# Patient Record
Sex: Male | Born: 1995 | Race: Black or African American | Hispanic: No | Marital: Single | Smoking: Never smoker
Health system: Southern US, Community
[De-identification: ages and names within clinical notes are randomized; demographics above are authoritative.]

---
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
## 2017-06-14 ENCOUNTER — Encounter (HOSPITAL_COMMUNITY): Payer: Self-pay | Admitting: Emergency Medicine

## 2017-06-14 ENCOUNTER — Ambulatory Visit (HOSPITAL_COMMUNITY)
Admission: EM | Admit: 2017-06-14 | Discharge: 2017-06-14 | Disposition: A | Discharge status: Home / Self Care | Payer: 59 | Attending: Family Medicine | Admitting: Family Medicine

## 2017-06-14 DIAGNOSIS — Z202 Contact with and (suspected) exposure to infections with a predominantly sexual mode of transmission: Secondary | ICD-10-CM | POA: Diagnosis present

## 2017-06-14 DIAGNOSIS — Z113 Encounter for screening for infections with a predominantly sexual mode of transmission: Secondary | ICD-10-CM | POA: Insufficient documentation

## 2017-06-14 NOTE — ED Notes (Signed)
Urine specimen obtained and in lab 

## 2017-06-14 NOTE — ED Triage Notes (Signed)
PT requests STD testing, no symptoms.  

## 2017-06-14 NOTE — Discharge Instructions (Signed)
Please withhold from intercourse until tests result. Will notify you of any positive findings and if any treatments are needed.   Please use condoms to prevent STD's.   You may check on MyChart to monitor results as well.

## 2017-06-14 NOTE — ED Provider Notes (Signed)
[facility]    CSN: [id number] Arrival date & time: 06/14/17  1338     History   Chief Complaint Chief Complaint  Patient presents with  . Exposure to STD    HPI Marvin Liu is a 22 y.o. male.   Marvin Liu presents with request for screen for stds. Denies any symptoms, denies urinary burning, penile discharge, lesions, sores to penis. No abdominal pain, back pain, fevers, pelvic pain. Denies any previous stds but states he has never been screened. States he is sexually active with females, has 1 current partner. No specific known exposure to STD. Endorses using condoms. Without contributing medical history.     ROS per HPI.      History reviewed. No pertinent past medical history.  There are no active problems to display for this patient.   History reviewed. No pertinent surgical history.     Home Medications    Prior to Admission medications   Not on File    Family History No family history on file.  Social History Social History   Tobacco Use  . Smoking status: Never Smoker  Substance Use Topics  . Alcohol use: No  . Drug use: Not on file     Allergies   Patient has no known allergies.   Review of Systems Review of Systems   Physical Exam Triage Vital Signs ED Triage Vitals  Enc Vitals Group     BP 06/14/17 1428 (!) 145/80     Pulse Rate 06/14/17 1428 72     Resp 06/14/17 1428 16     Temp 06/14/17 1428 99 F (37.2 C)     Temp Source 06/14/17 1428 Temporal     SpO2 06/14/17 1428 100 %     Weight 06/14/17 1428 186 lb (84.4 kg)     Height --      Head Circumference --      Peak Flow --      Pain Score 06/14/17 1427 0     Pain Loc --      Pain Edu? --      Excl. in GC? --    No data found.  Updated Vital Signs BP (!) 145/80   Pulse 72   Temp 99 F (37.2 C) (Temporal)   Resp 16   Wt 186 lb (84.4 kg)   SpO2 100%   Visual Acuity Right Eye Distance:   Left Eye Distance:   Bilateral Distance:    Right Eye Near:     Left Eye Near:    Bilateral Near:     Physical Exam  Constitutional: He is oriented to person, place, and time. He appears well-developed and well-nourished.  Cardiovascular: Normal rate and regular rhythm.  Pulmonary/Chest: Effort normal and breath sounds normal.  Genitourinary:  Genitourinary Comments: Denies lesions, sores, swelling, redness, discharge; exam deferred at this time.   Neurological: He is alert and oriented to person, place, and time.  Skin: Skin is warm and dry.     UC Treatments / Results  Labs (all labs ordered are listed, but only abnormal results are displayed) Labs Reviewed  RPR  HIV ANTIBODY (ROUTINE TESTING)  URINE CYTOLOGY ANCILLARY ONLY    EKG None  Radiology No results found.  Procedures Procedures (including critical care time)  Medications Ordered in UC Medications - No data to display  Initial Impression / Assessment and Plan / UC Course  I have reviewed the triage vital signs and the nursing notes.  Pertinent labs & imaging  results that were available during my care of the patient were reviewed by me and considered in my medical decision making (see chart for details).     Urine cytology, RPR and HIV pending. Will notify of any positive findings and if any changes to treatment are needed.  Encouraged continued use of condoms to prevent STDS. Patient verbalized understanding and agreeable to plan.    Final Clinical Impressions(s) / UC Diagnoses   Final diagnoses:  Screen for STD (sexually transmitted disease)     Discharge Instructions     Please withhold from intercourse until tests result. Will notify you of any positive findings and if any treatments are needed.   Please use condoms to prevent STD's.   You may check on MyChart to monitor results as well.    ED Prescriptions    None     Controlled Substance Prescriptions [place] Controlled Substance Registry consulted? Not Applicable   Georgetta Haber, NP 06/14/17  1535

## 2017-06-15 ENCOUNTER — Telehealth (HOSPITAL_COMMUNITY): Payer: Self-pay

## 2017-06-15 LAB — URINE CYTOLOGY ANCILLARY ONLY
CHLAMYDIA, DNA PROBE: NEGATIVE
NEISSERIA GONORRHEA: NEGATIVE
Trichomonas: NEGATIVE

## 2017-06-15 LAB — RPR: RPR Ser Ql: NONREACTIVE

## 2017-06-15 LAB — HIV ANTIBODY (ROUTINE TESTING W REFLEX): HIV Screen 4th Generation wRfx: NONREACTIVE

## 2017-06-15 NOTE — Telephone Encounter (Signed)
Results are within normal range. Pt contacted and made aware. Verbalized understanding.   

## 2019-01-28 ENCOUNTER — Encounter (HOSPITAL_COMMUNITY): Payer: Self-pay | Admitting: Emergency Medicine

## 2019-01-28 ENCOUNTER — Other Ambulatory Visit: Payer: Self-pay

## 2019-01-28 ENCOUNTER — Emergency Department (HOSPITAL_COMMUNITY): Payer: Managed Care, Other (non HMO)

## 2019-01-28 ENCOUNTER — Emergency Department (HOSPITAL_COMMUNITY)
Admission: EM | Admit: 2019-01-28 | Discharge: 2019-01-28 | Disposition: A | Discharge status: Home / Self Care | Payer: Managed Care, Other (non HMO) | Attending: Emergency Medicine | Admitting: Emergency Medicine

## 2019-01-28 DIAGNOSIS — R519 Headache, unspecified: Secondary | ICD-10-CM | POA: Diagnosis present

## 2019-01-28 DIAGNOSIS — R112 Nausea with vomiting, unspecified: Secondary | ICD-10-CM | POA: Diagnosis not present

## 2019-01-28 DIAGNOSIS — H53149 Visual discomfort, unspecified: Secondary | ICD-10-CM | POA: Insufficient documentation

## 2019-01-28 LAB — BASIC METABOLIC PANEL
Anion gap: 8 (ref 5–15)
BUN: 14 mg/dL (ref 6–20)
CO2: 26 mmol/L (ref 22–32)
Calcium: 9.2 mg/dL (ref 8.9–10.3)
Chloride: 103 mmol/L (ref 98–111)
Creatinine, Ser: 1.1 mg/dL (ref 0.61–1.24)
GFR calc Af Amer: >60 mL/min (ref >60)
GFR calc non Af Amer: >60 mL/min (ref >60)
Glucose, Bld: 123 mg/dL — ABNORMAL HIGH (ref 70–99)
Potassium: 4.3 mmol/L (ref 3.5–5.1)
Sodium: 137 mmol/L (ref 135–145)

## 2019-01-28 LAB — I-STAT CHEM 8, ED
BUN: 15 mg/dL (ref 6–20)
Calcium, Ion: 1.22 mmol/L (ref 1.15–1.40)
Chloride: 102 mmol/L (ref 98–111)
Creatinine, Ser: 1 mg/dL (ref 0.61–1.24)
Glucose, Bld: 119 mg/dL — ABNORMAL HIGH (ref 70–99)
HCT: 47 % (ref 39.0–52.0)
Hemoglobin: 16 g/dL (ref 13.0–17.0)
Potassium: 4.2 mmol/L (ref 3.5–5.1)
Sodium: 140 mmol/L (ref 135–145)
TCO2: 28 mmol/L (ref 22–32)

## 2019-01-28 LAB — CBC WITH DIFFERENTIAL/PLATELET
Abs Immature Granulocytes: 0.03 10*3/uL (ref 0.00–0.07)
Basophils Absolute: 0 10*3/uL (ref 0.0–0.1)
Basophils Relative: 1 %
Eosinophils Absolute: 0.1 10*3/uL (ref 0.0–0.5)
Eosinophils Relative: 1 %
HCT: 46.8 % (ref 39.0–52.0)
Hemoglobin: 15.5 g/dL (ref 13.0–17.0)
Immature Granulocytes: 0 %
Lymphocytes Relative: 16 %
Lymphs Abs: 1.3 10*3/uL (ref 0.7–4.0)
MCH: 30.3 pg (ref 26.0–34.0)
MCHC: 33.1 g/dL (ref 30.0–36.0)
MCV: 91.6 fL (ref 80.0–100.0)
Monocytes Absolute: 0.3 10*3/uL (ref 0.1–1.0)
Monocytes Relative: 4 %
Neutro Abs: 6.4 10*3/uL (ref 1.7–7.7)
Neutrophils Relative %: 78 %
Platelets: 191 10*3/uL (ref 150–400)
RBC: 5.11 MIL/uL (ref 4.22–5.81)
RDW: 12.9 % (ref 11.5–15.5)
WBC: 8.2 10*3/uL (ref 4.0–10.5)
nRBC: 0 % (ref 0.0–0.2)

## 2019-01-28 MED ORDER — MAGNESIUM SULFATE 2 GM/50ML IV SOLN: 2.0000 g | Freq: Once | INTRAVENOUS | Status: DC

## 2019-01-28 MED ORDER — DIPHENHYDRAMINE HCL 50 MG/ML IJ SOLN
25.0000 mg | Freq: Once | INTRAMUSCULAR | Status: AC
  Administered 2019-01-28: 25 mg via INTRAVENOUS
  Filled 2019-01-28: qty 1

## 2019-01-28 MED ORDER — PROCHLORPERAZINE EDISYLATE 10 MG/2ML IJ SOLN
10.0000 mg | Freq: Once | INTRAMUSCULAR | Status: AC
  Administered 2019-01-28: 10 mg via INTRAVENOUS
  Filled 2019-01-28: qty 2

## 2019-01-28 MED ORDER — SODIUM CHLORIDE 0.9 % IV BOLUS: 1000.0000 mL | Freq: Once | INTRAVENOUS | Status: DC

## 2019-01-28 MED ORDER — DEXAMETHASONE SODIUM PHOSPHATE 10 MG/ML IJ SOLN: 10.0000 mg | Freq: Once | INTRAMUSCULAR | Status: DC

## 2019-01-28 MED ORDER — IOHEXOL 350 MG/ML SOLN
100.0000 mL | Freq: Once | INTRAVENOUS | Status: AC | PRN
  Administered 2019-01-28: 16:00:00 100 mL via INTRAVENOUS

## 2019-01-28 MED ORDER — FENTANYL CITRATE (PF) 100 MCG/2ML IJ SOLN
50.0000 ug | Freq: Once | INTRAMUSCULAR | Status: AC
  Administered 2019-01-28: 50 ug via INTRAVENOUS
  Filled 2019-01-28: qty 2

## 2019-01-28 MED ORDER — KETOROLAC TROMETHAMINE 30 MG/ML IJ SOLN
30.0000 mg | Freq: Once | INTRAMUSCULAR | Status: DC
Start: 2019-01-28 — End: 2019-01-28

## 2019-01-28 NOTE — Discharge Instructions (Signed)
Drink plenty of fluids and get plenty of rest. You can take 1 to 2 tablets of Tylenol (350mg -1000mg  depending on the dose) every 6 hours as needed for pain.  Do not exceed 4000 mg of Tylenol daily.  If your pain persists you can take a dose of ibuprofen in between doses of Tylenol.  I usually recommend 400 to 600 mg of ibuprofen every 6 hours.  Take this with food to avoid upset stomach issues.  I suspect that you had a migraine headache.  Your work-up today was reassuring with no signs of aneurysm, mass, or brain bleed.  Follow-up with neurology or your PCP on an outpatient basis for reevaluation of your symptoms.    Return to the emergency department if any concerning signs or symptoms develop such as fevers, neck stiffness, severe pain, weakness to 1 side of the body persistent vomiting, or loss of consciousness.

## 2019-01-28 NOTE — ED Triage Notes (Signed)
Onset today at 1100 developed right side headache took Advil prior to arrival states pain currently 9/10 throbbing. Alert answering and following commands appropriate.

## 2019-01-28 NOTE — ED Notes (Signed)
Witnessed waste of 37mcg fentanyl for Domingo Mend, RN

## 2019-01-28 NOTE — ED Notes (Signed)
Called CT to expedite CT head for patient.  States patient will be next.

## 2019-01-28 NOTE — ED Provider Notes (Signed)
[facility] EMERGENCY DEPARTMENT Provider Note   CSN: [id number] Arrival date & time: 01/28/19  1256     History Chief Complaint  Patient presents with  . Headache    Marvin Liu is a 23 y.o. male presents for evaluation of sudden onset, progressively worsening right-sided headache.  Reports symptoms began at around 11:30 AM while standing at his computer screen at work.  Reports that it took approximately 15 minutes from when the headache began for it to get to its most severe 9/10 throbbing pain.  He notes photophobia.  Denies numbness or weakness of the extremities, fevers, neck stiffness, recent travel.  He reports nausea and states that he "forced myself to throw up".  Denies chest pain, shortness of breath, or abdominal pain.  He states he has never had a headache like this before.  No family history of aneurysm or CVA at a young age.  The history is provided by the patient.       History reviewed. No pertinent past medical history.  There are no problems to display for this patient.   History reviewed. No pertinent surgical history.     No family history on file.  Social History   Tobacco Use  . Smoking status: Never Smoker  Substance Use Topics  . Alcohol use: No  . Drug use: Not on file    Home Medications Prior to Admission medications   Not on File    Allergies    Patient has no known allergies.  Review of Systems   Review of Systems  Constitutional: Negative for chills and fever.  Eyes: Positive for photophobia. Negative for visual disturbance.  Respiratory: Negative for shortness of breath.   Cardiovascular: Negative for chest pain.  Gastrointestinal: Positive for nausea and vomiting. Negative for abdominal pain.  Musculoskeletal: Negative for neck stiffness.  Neurological: Positive for headaches. Negative for weakness and numbness.  All other systems reviewed and are negative.   Physical Exam Updated Vital Signs BP  (!) 148/86 (BP Location: Left Arm)   Pulse 66   Temp 97.7 F (36.5 C) (Oral)   Resp 16   Ht 6' (1.829 m)   Wt 88 kg   SpO2 98%   BMI 26.31 kg/m   Physical Exam Vitals and nursing note reviewed.  Constitutional:      General: He is in acute distress.     Appearance: He is well-developed.     Comments: Appears uncomfortable, laying on left side  HENT:     Head: Normocephalic and atraumatic.  Eyes:     General:        Right eye: No discharge.        Left eye: No discharge.     Conjunctiva/sclera: Conjunctivae normal.  Neck:     Vascular: No JVD.     Trachea: No tracheal deviation.  Cardiovascular:     Rate and Rhythm: Normal rate and regular rhythm.  Pulmonary:     Effort: Pulmonary effort is normal.     Breath sounds: Normal breath sounds.  Abdominal:     General: There is no distension.     Tenderness: There is no abdominal tenderness. There is no guarding.  Musculoskeletal:        General: Normal range of motion.     Cervical back: Normal range of motion and neck supple. No rigidity.  Skin:    General: Skin is warm and dry.     Findings: No erythema.  Neurological:  Mental Status: He is alert and oriented to person, place, and time.     GCS: GCS eye subscore is 4. GCS verbal subscore is 5. GCS motor subscore is 6.     Comments: Mental Status:  Alert, thought content appropriate, able to give a coherent history. Speech fluent without evidence of aphasia. Able to follow 2 step commands without difficulty.  Cranial Nerves:  II:  Peripheral visual fields grossly normal, pupils equal, round, reactive to light III,IV, VI: ptosis not present, extra-ocular motions intact bilaterally  V,VII: smile symmetric, facial light touch sensation equal VIII: hearing grossly normal to voice  X: uvula elevates symmetrically  XI: bilateral shoulder shrug symmetric and strong XII: midline tongue extension without fassiculations Motor:  Normal tone. 5/5 strength of BUE and BLE major  muscle groups including strong and equal grip strength and dorsiflexion/plantar flexion Sensory: light touch normal in all extremities. Cerebellar: normal finger-to-nose with bilateral upper extremities, Romberg sign absent Gait: normal gait and balance. Able to walk on toes and heels with ease.    Psychiatric:        Behavior: Behavior normal.     ED Results / Procedures / Treatments   Labs (all labs ordered are listed, but only abnormal results are displayed) Labs Reviewed  BASIC METABOLIC PANEL - Abnormal; Notable for the following components:      Result Value   Glucose, Bld 123 (*)    All other components within normal limits  I-STAT CHEM 8, ED - Abnormal; Notable for the following components:   Glucose, Bld 119 (*)    All other components within normal limits  CBC WITH DIFFERENTIAL/PLATELET  CBC WITH DIFFERENTIAL/PLATELET    EKG None  Radiology CT Angio Head W or Wo Contrast  Result Date: 01/28/2019 CLINICAL DATA:  Right-sided headache and nausea. EXAM: CT ANGIOGRAPHY HEAD AND NECK TECHNIQUE: Multidetector CT imaging of the head and neck was performed using the standard protocol during bolus administration of intravenous contrast. Multiplanar CT image reconstructions and MIPs were obtained to evaluate the vascular anatomy. Carotid stenosis measurements (when applicable) are obtained utilizing NASCET criteria, using the distal internal carotid diameter as the denominator. CONTRAST:  120mL OMNIPAQUE IOHEXOL 350 MG/ML SOLN COMPARISON:  CT head without contrast 01/28/2019 FINDINGS: CTA NECK FINDINGS Aortic arch: A three-vessel scratched at there is common origin of the left common carotid artery and innominate artery. No significant atherosclerotic disease is present. There is no aneurysm or stenosis. Right carotid system: Right common carotid artery is within normal limits. Bifurcation is unremarkable. Cervical right ICA is normal. Left carotid system: The left common carotid  artery is within normal limits. Bifurcation is unremarkable. Cervical left ICA is normal. Vertebral arteries: The vertebral arteries are codominant. Both vertebral arteries originate from the subclavian arteries without significant stenosis. There is no significant stenosis of either vertebral artery in the neck. Skeleton: Vertebral body heights and alignment are normal. Minimal endplate degenerative changes present at C4-5. No focal lytic or blastic lesions are present. Other neck: No focal mucosal or submucosal lesions are present. Salivary glands are normal. No significant adenopathy is present. Thyroid is normal. Upper chest: Upper lung fields are clear. Thoracic inlet is normal. Review of the MIP images confirms the above findings CTA HEAD FINDINGS Anterior circulation: Internal carotid arteries are within normal limits from the high cervical segments through the ICA scratched at the internal carotid arteries are within normal limits through the ICA termini bilaterally. The A1 and M1 segments are normal. The MCA bifurcations  are intact. ACA and MCA branch vessels are normal. There is no aneurysm. Posterior circulation: The vertebral arteries are codominant. AICA a vessels are dominant. The basilar artery is normal. Both posterior cerebral arteries originate from the basilar tip. PCA branch vessels are within normal limits bilaterally. Venous sinuses: The dural sinuses are patent. The straight sinus and deep cerebral veins are intact. Cortical veins are unremarkable. 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 Anatomic variants: None Review of the MIP images confirms the above findings IMPRESSION: 1. Negative CTA of the neck. 2. Normal variant CTA Circle of Willis without significant proximal stenosis, aneurysm, or branch vessel occlusion. Electronically Signed   By: Marin Roberts M.D.   On: 01/28/2019 16:00   CT Head Wo Contrast  Result Date: 01/28/2019 CLINICAL DATA:  Right-sided headache with nausea  vomiting. Rule out subarachnoid hemorrhage. EXAM: CT HEAD WITHOUT CONTRAST TECHNIQUE: Contiguous axial images were obtained from the base of the skull through the vertex without intravenous contrast. COMPARISON:  None. FINDINGS: Brain: No evidence of acute infarction, hemorrhage, hydrocephalus, extra-axial collection or mass lesion/mass effect. Vascular: Negative for hyperdense vessel Skull: Negative Sinuses/Orbits: Mild mucosal edema paranasal sinuses. Negative orbit Other: None IMPRESSION: Negative CT brain Mucosal disease in the paranasal sinuses. Electronically Signed   By: Marlan Palau M.D.   On: 01/28/2019 13:51   CT Angio Neck W and/or Wo Contrast  Result Date: 01/28/2019 CLINICAL DATA:  Right-sided headache and nausea. EXAM: CT ANGIOGRAPHY HEAD AND NECK TECHNIQUE: Multidetector CT imaging of the head and neck was performed using the standard protocol during bolus administration of intravenous contrast. Multiplanar CT image reconstructions and MIPs were obtained to evaluate the vascular anatomy. Carotid stenosis measurements (when applicable) are obtained utilizing NASCET criteria, using the distal internal carotid diameter as the denominator. CONTRAST:  OMNIPAQUE IOHEXOL 350 MG/ML SOLN COMPARISON:  CT head without contrast 01/28/2019 FINDINGS: CTA NECK FINDINGS Aortic arch: A three-vessel scratched at there is common origin of the left common carotid artery and innominate artery. No significant atherosclerotic disease is present. There is no aneurysm or stenosis. Right carotid system: Right common carotid artery is within normal limits. Bifurcation is unremarkable. Cervical right ICA is normal. Left carotid system: The left common carotid artery is within normal limits. Bifurcation is unremarkable. Cervical left ICA is normal. Vertebral arteries: The vertebral arteries are codominant. Both vertebral arteries originate from the subclavian arteries without significant stenosis. There is no  significant stenosis of either vertebral artery in the neck. Skeleton: Vertebral body heights and alignment are normal. Minimal endplate degenerative changes present at C4-5. No focal lytic or blastic lesions are present. Other neck: No focal mucosal or submucosal lesions are present. Salivary glands are normal. No significant adenopathy is present. Thyroid is normal. Upper chest: Upper lung fields are clear. Thoracic inlet is normal. Review of the MIP images confirms the above findings CTA HEAD FINDINGS Anterior circulation: Internal carotid arteries are within normal limits from the high cervical segments through the ICA scratched at the internal carotid arteries are within normal limits through the ICA termini bilaterally. The A1 and M1 segments are normal. The MCA bifurcations are intact. ACA and MCA branch vessels are normal. There is no aneurysm. Posterior circulation: The vertebral arteries are codominant. AICA a vessels are dominant. The basilar artery is normal. Both posterior cerebral arteries originate from the basilar tip. PCA branch vessels are within normal limits bilaterally. Venous sinuses: The dural sinuses are patent. The straight sinus and deep cerebral veins are intact. Cortical veins are  unremarkable. 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 Anatomic variants: None Review of the MIP images confirms the above findings IMPRESSION: 1. Negative CTA of the neck. 2. Normal variant CTA Circle of Willis without significant proximal stenosis, aneurysm, or branch vessel occlusion. Electronically Signed   By: Marin Robertshristopher  Mattern M.D.   On: 01/28/2019 16:00    Procedures Procedures (including critical care time)  Medications Ordered in ED Medications  prochlorperazine (COMPAZINE) injection 10 mg (10 mg Intravenous Given 01/28/19 1429)  diphenhydrAMINE (BENADRYL) injection 25 mg (25 mg Intravenous Given 01/28/19 1428)  fentaNYL (SUBLIMAZE) injection 50 mcg (50 mcg Intravenous Given 01/28/19 1431)    iohexol (OMNIPAQUE) 350 MG/ML injection 100 mL (100 mLs Intravenous Contrast Given 01/28/19 1543)    ED Course  I have reviewed the triage vital signs and the nursing notes.  Pertinent labs & imaging results that were available during my care of the patient were reviewed by me and considered in my medical decision making (see chart for details).    MDM Rules/Calculators/A&P                      Patient presenting for evaluation of sudden onset right-sided headache with associated photophobia.  He is afebrile, mildly hypertensive on initial assessment but appears quite uncomfortable.  No focal neurologic deficits identified.  He underwent CT head and subsequently CTA head and neck within 6 hours of symptom onset which showed no evidence of SAH, ICH, CVA, mass, or other hemorrhage.  He does have normal variant noted of the circle of Willis without significant proximal stenosis, aneurysm, or branch vessel occlusion.  No fever or meningeal signs to suggest meningitis.  Patient received migraine cocktail in the ED with significant improvement in his pain from 9/10 in severity down to 2/10 in severity on reassessment.  He is tolerating p.o. fluids without difficulty, ambulatory without difficulty.  Reports he is feeling much better.  Spoke with Dr. Amada JupiterKirkpatrick who suspects the patient had a migraine headache.  Given he had imaging within 6 hours of headache onset, low suspicion of SAH at this time. I discussed close follow-up with neurology or PCP on an outpatient basis for reevaluation with the patient.  Discussed strict ED return precautions. Patient verbalized understanding of and agreement with plan and is safe for discharge home at this time.  Discussed case with Dr. Criss AlvineGoldston who agrees with assessment and plan at this time.  Final Clinical Impression(s) / ED Diagnoses Final diagnoses:  Bad headache    Rx / DC Orders ED Discharge Orders    None       Jeanie SewerFawze, Mariyah Upshaw A, PA-C 01/28/19 1704     Pricilla LovelessGoldston, Scott, MD 01/29/19 1735

## 2019-01-28 NOTE — ED Notes (Addendum)
Wasted 50 mcg of fentanyl with Karlene Lineman, RN

## 2019-01-28 NOTE — ED Notes (Signed)
Patient verbalizes understanding of discharge instructions. Opportunity for questioning and answers were provided. Armband removed by staff, pt discharged from ED.  

## 2019-01-28 NOTE — ED Notes (Signed)
Called CT to let them know the chem 8 is resulted and the pt is ready for CT.

## 2019-02-13 ENCOUNTER — Telehealth: Payer: Self-pay | Admitting: Neurology

## 2019-02-13 ENCOUNTER — Other Ambulatory Visit: Payer: Self-pay

## 2019-02-13 ENCOUNTER — Ambulatory Visit (INDEPENDENT_AMBULATORY_CARE_PROVIDER_SITE_OTHER): Payer: BC Managed Care – PPO | Admitting: Neurology

## 2019-02-13 ENCOUNTER — Encounter: Payer: Self-pay | Admitting: Neurology

## 2019-02-13 VITALS — BP 143/83 | HR 77 | Temp 97.7°F | Ht 72.0 in | Wt 196.0 lb

## 2019-02-13 DIAGNOSIS — G43019 Migraine without aura, intractable, without status migrainosus: Secondary | ICD-10-CM

## 2019-02-13 MED ORDER — RIZATRIPTAN BENZOATE 5 MG PO TBDP: 5.0000 mg | ORAL_TABLET | ORAL | 2 refills | Status: AC | PRN

## 2019-02-13 NOTE — Patient Instructions (Addendum)
Your neurological exam is normal.  You may benefit from getting new eyeglasses as recommended by your eye doctor.  We can consider a sleep study if there is any concern for sleep apnea such as loud snoring, stops in your breathing while asleep, gasping sounds or if you wake up with a headache on a recurrent basis.  I do believe you have migraines.  I think you will benefit from The injectable medication called Aimovig which was given to you by your doctor at [facility]. This medication is approved for migraine prevention.  You have also received a prescription for a nausea medication for as needed use as I understand.  Please pick up your prescriptions.  From my end of things, I recommend for acute treatment of your migraines: Maxalt orally disintegrating tab, 5 mg: take 1 pill early on when you suspect a migraine attack come on. You may take another pill within 2 hours, no more than 2 pills in 24 hours. Most people who take triptans do not have any serious side-effects. However, they can cause drowsiness (remember to not drive or use heavy machinery when drowsy), nausea, dizziness, dry mouth. Less common side effects include strange sensations, such as tightness in your chest or throat, tingling, flushing, and feelings of heaviness or pressure in areas such as the face, limbs, and chest. These in the chest can mimic heart related pain (angina) and may cause alarm, but usually these sensations are not harmful or a sign of a heart attack. However, if you develop intense chest pain or sensations of discomfort, you should stop taking your medication and consult with me or your PCP or go to the nearest urgent care facility or ER or call 911.

## 2019-02-13 NOTE — Telephone Encounter (Signed)
Patient stopped by check-out after visit today. Once we scheduled follow-up appointment, patient stated that he had another question, but did not say the reason. He requests that RN or MD call him.

## 2019-02-13 NOTE — Progress Notes (Signed)
Subjective:    Patient ID: Marvin Liu is a 24 y.o. male.  HPI     Marvin Foley, MD, PhD [facility] [address], [address] [address] [place], [place] [address]  I saw Marvin Liu as a referral from the emergency room for a recent migraine.  The patient is unaccompanied today.  He is a 24 year old right-handed gentleman with a benign medical history who presented to the emergency room on 01/28/2019 with new onset one-sided throbbing headache associated with light sensitivity and nausea.  He had one episode of vomiting.  I reviewed the emergency room records.  He was treated symptomatically with IV Compazine, IV Benadryl, IV fentanyl, and improved, he was not noted to have any focal neurologic deficit, he had neuro imaging.  He had a head CT without contrast on 01/28/2019 and I reviewed the results: IMPRESSION: Negative CT brain   Mucosal disease in the paranasal sinuses.  He also had a CT angiogram head and neck on 01/26/2019 and I reviewed the results:   IMPRESSION: 1. Negative CTA of the neck. 2. Normal variant CTA Circle of Willis without significant proximal stenosis, aneurysm, or branch vessel occlusion.   He reports that his headache started in mid December with recurrent throbbing headaches mostly on the right side, start behind the eyes.  He has associated light sensitivity and nausea, he was given a prescription for his nausea but has not picked it up yet.  He also recently saw his new primary care provider at [facility] and was given a prescription for Aimovig but has not picked it up yet.  His pharmacy has not called yet.  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  He has a family history of migraines affecting his mother.  He does not have any significant sleep related issues, he denies any significant daytime  somnolence.  He has a sedentary job, works at [employer] and on the phone typically.  Epworth sleepiness score is 4 out of 24, fatigue severity score is 9 out of 63.  He lives with his girlfriend, he snores intermittently, typically only while on the back.  She has not mentioned any pauses in his breathing and he has not woken up with a sense of gasping for air.  He has woken up with a headache rarely, maybe twice thus far, he does have nocturia about once per average night, no family history of sleep apnea as far as he knows.  He is a non-smoker and drinks alcohol rarely, no daily caffeine. Overall, he feels better but he has had recurrent headaches, approximately every third day or so.He does take over-the-counter Tylenol or ibuprofen as needed, which do not always help.  His Past Medical History Is Significant For: No past medical history on file.  His Past Surgical History Is Significant For: No past surgical history on file.  His Family History Is Significant For: No family history on file.  His Social History Is Significant For: Social History   Socioeconomic History  . Marital status: Single    Spouse name: Not on file  . Number of children: Not on file  . Years of education: Not on file  . Highest education level: Not on file  Occupational History  . Not on file  Tobacco Use  . Smoking status: Never Smoker  . Smokeless  tobacco: Never Used  Substance and Sexual Activity  . Alcohol use: No  . Drug use: Not Currently  . Sexual activity: Not Currently  Other Topics Concern  . Not on file  Social History Narrative  . Not on file   Social Determinants of Health   Financial Resource Strain:   . Difficulty of Paying Living Expenses: Not on file  Food Insecurity:   . Worried About Programme researcher, broadcasting/film/video in the Last Year: Not on file  . Ran Out of Food in the Last Year: Not on file  Transportation Needs:   . Lack of Transportation (Medical): Not on file  . Lack of  Transportation (Non-Medical): Not on file  Physical Activity:   . Days of Exercise per Week: Not on file  . Minutes of Exercise per Session: Not on file  Stress:   . Feeling of Stress : Not on file  Social Connections:   . Frequency of Communication with Friends and Family: Not on file  . Frequency of Social Gatherings with Friends and Family: Not on file  . Attends Religious Services: Not on file  . Active Member of Clubs or Organizations: Not on file  . Attends Banker Meetings: Not on file  . Marital Status: Not on file    His Allergies Are:  No Known Allergies:   His Current Medications Are:  Outpatient Encounter Medications as of 02/13/2019  Medication Sig  . Acetaminophen (TYLENOL) 325 MG CAPS Take by mouth.  . Ibuprofen (MOTRIN PO) Take by mouth.   No facility-administered encounter medications on file as of 02/13/2019.  :  Review of Systems:  Out of a complete 14 point review of systems, all are reviewed and negative with the exception of these symptoms as listed below:   Review of Systems  Constitutional: Negative.   HENT: Negative.   Eyes: Negative.   Respiratory: Negative.   Cardiovascular: Negative.   Gastrointestinal: Negative.   Endocrine: Negative.   Neurological: Positive for headaches.  Psychiatric/Behavioral:       Epworth Sleepiness Scale 0= would never doze 1= slight chance of dozing 2= moderate chance of dozing 3= high chance of dozing  Sitting and reading: 1 Watching TV: 1 Sitting inactive in a public place (ex. Theater or meeting):0 As a passenger in a car for an hour without a break:1 Lying down to rest in the afternoon:1 Sitting and talking to someone:0 Sitting quietly after lunch (no alcohol):0 In a car, while stopped in traffic:0 Total:4     Objective:  Neurological Exam  Physical Exam Physical Examination:   Vitals:   02/13/19 1405  BP: (!) 143/83  Pulse: 77  Temp: 97.7 F (36.5 C)    General Examination:  The patient is a very pleasant 24 y.o. male in no acute distress. He appears well-developed and well-nourished and very well groomed.   HEENT: Normocephalic, atraumatic, pupils are equal, round and reactive to light and accommodation. Funduscopic exam is normal with sharp disc margins noted. Extraocular tracking is good without limitation to gaze excursion or nystagmus noted. Normal smooth pursuit is noted. Hearing is grossly intact. Tympanic membranes are clear bilaterally. Face is symmetric with normal facial animation and normal facial sensation. Speech is clear with no dysarthria noted. There is no hypophonia. There is no lip, neck/head, jaw or voice tremor. Neck is supple with full range of passive and active motion. There are no carotid bruits on auscultation. Oropharynx exam reveals: mild mouth dryness, good dental hygiene and  mild airway crowding, due to Tonsillar size of 1-2+ and wider uvula.  Mallampati is class II, tongue protrudes centrally in palate elevates symmetrically.   Chest: Clear to auscultation without wheezing, rhonchi or crackles noted.  Heart: S1+S2+0, regular and normal without murmurs, rubs or gallops noted.   Abdomen: Soft, non-tender and non-distended with normal bowel sounds appreciated on auscultation.  Extremities: There is no pitting edema in the distal lower extremities bilaterally. Pedal pulses are intact.  Skin: Warm and dry without trophic changes noted.  Musculoskeletal: exam reveals no obvious joint deformities, tenderness or joint swelling or erythema.   Neurologically:  Mental status: The patient is awake, alert and oriented in all 4 spheres. His immediate and remote memory, attention, language skills and fund of knowledge are appropriate. There is no evidence of aphasia, agnosia, apraxia or anomia. Speech is clear with normal prosody and enunciation. Thought process is linear. Mood is normal and affect is normal.  Cranial nerves II - XII are as described  above under HEENT exam. In addition: shoulder shrug is normal with equal shoulder height noted. Motor exam: Normal bulk, strength and tone is noted. There is no drift, tremor or rebound. Romberg is negative. Reflexes are 2+ throughout. Babinski: Toes are flexor bilaterally. Fine motor skills and coordination: intact with normal finger taps, normal hand movements, normal rapid alternating patting, normal foot taps and normal foot agility.  Cerebellar testing: No dysmetria or intention tremor on finger to nose testing. Heel to shin is unremarkable bilaterally. There is no truncal or gait ataxia.  Sensory exam: intact to light touch in the upper and lower extremities.  Gait, station and balance: He stands easily. No veering to one side is noted. No leaning to one side is noted. Posture is age-appropriate and stance is narrow based. Gait shows normal stride length and normal pace. No problems turning are noted. Tandem walk is unremarkable.   Assessment and Plan:  In summary, Marvin Liu is a very pleasant 24 y.o.-year old male with a Benign medical history, who presents for evaluation of his recurrent headaches which started about a month ago.  He went to the emergency room on 01/28/2019 and was treated symptomatically.  He had imaging tests including head CT, CT angio head and neck, all benign.  His neurological exam is benign.  He is largely reassured today.  History and physical examination are supportive of migraine without aura.  He has had recurrent migraines.  He has not been on any preventative yet.  His new primary care provider at [facility] has prescribed Aimovig.  He has not started it yet.  He was also given a prescription for nausea, has not picked up the prescription yet.  I suggested for acute migraine management Maxalt under the tongue, 5 mg strength.  We talked about expectations and potential side effects and he was given a new prescription as well as written instructions. His history  is not telltale for obstructive sleep apnea.  Nevertheless, we can pursue a sleep study if needed.  He does report snoring and nocturia and has woken up with a headache. We can certainly consider a sleep study if needed down the road. He had a recent eye examination and was told that his right eye was farsighted, he may benefit from actually getting his prescription eyeglasses.  He is encouraged to stay well-hydrated, well rested, try Maxalt as needed, nausea medicine as needed and start [place] for migraine prevention.  He is advised to follow-up routinely to see  one of our nurse practitioners in 3 months, sooner if needed.  I answered all his questions today and he was in agreement. Marvin Foley, MD, PhD

## 2019-02-13 NOTE — Telephone Encounter (Signed)
I called pt back about some questions he had. Pt wanted to know if Dr. Frances Furbish was going to pursue seep consult. I stated her note stated to take maxalt for headaches as needed.Also her note states if he is snoring when sleeping and waking up with a headache she will pursue sleep consult and to just call us back. Pt verbalized understanding.

## 2019-05-13 NOTE — Progress Notes (Deleted)
PATIENT: Marvin Liu DOB: Nov 23, 1995  REASON FOR VISIT: follow up HISTORY FROM: patient  No chief complaint on file.    HISTORY OF PRESENT ILLNESS: Today 05/13/19 Marvin Liu is a 24 y.o. male here today for follow up for migraines. He was started on Amovig by PCP. Dr Rexene Alberts recommended Maxalt for acute treatment.   HISTORY: (copied from Dr Guadelupe Sabin note on 02/13/2019)  I saw Marvin Liu as a referral from the emergency room for a recent migraine.  The patient is unaccompanied today.  He is a 25 year old right-handed gentleman with a benign medical history who presented to the emergency room on 01/28/2019 with new onset one-sided throbbing headache associated with light sensitivity and nausea.  He had one episode of vomiting.  I reviewed the emergency room records.  He was treated symptomatically with IV Compazine, IV Benadryl, IV fentanyl, and improved, he was not noted to have any focal neurologic deficit, he had neuro imaging.  He had a head CT without contrast on 01/28/2019 and I reviewed the results: IMPRESSION: Negative CT brain  Mucosal disease in the paranasal sinuses.  He also had a CT angiogram head and neck on 01/26/2019 and I reviewed the results:  IMPRESSION: 1. Negative CTA of the neck. 2. Normal variant CTA Circle of Willis without significant proximal stenosis, aneurysm, or branch vessel occlusion.  He reports that his headache started in mid December with recurrent throbbing headaches mostly on the right side, start behind the eyes.  He has associated light sensitivity and nausea, he was given a prescription for his nausea but has not picked it up yet.  He also recently saw his new primary care provider at [facility] and was given a prescription for Aimovig but has not picked it up yet.  His pharmacy has not called yet.  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  He has a family history of migraines affecting his mother.  He does not have any significant sleep related issues, he denies any significant daytime somnolence.  He has a sedentary job, works at [employer] and on the phone typically.  Epworth sleepiness score is 4 out of 24, fatigue severity score is 9 out of 63.  He lives with his girlfriend, he snores intermittently, typically only while on the back.  She has not mentioned any pauses in his breathing and he has not woken up with a sense of gasping for air.  He has woken up with a headache rarely, maybe twice thus far, he does have nocturia about once per average night, no family history of sleep apnea as far as he knows.  He is a non-smoker and drinks alcohol rarely, no daily caffeine. Overall, he feels better but he has had recurrent headaches, approximately every third day or so.He does take over-the-counter Tylenol or ibuprofen as needed, which do not always help.   REVIEW OF SYSTEMS: Out of a complete 14 system review of symptoms, the patient complains only of the following symptoms, and all other reviewed systems are negative.  ALLERGIES: No Known Allergies  HOME MEDICATIONS: Outpatient Medications Prior to Visit  Medication Sig Dispense Refill  . Acetaminophen (TYLENOL) 325 MG CAPS Take by mouth.    . Ibuprofen (MOTRIN PO) Take by mouth.    . rizatriptan (MAXALT-MLT) 5 MG disintegrating tablet Take 1 tablet (5  mg total) by mouth as needed for migraine. May repeat in 2 hours if needed 10 tablet 2   No facility-administered medications prior to visit.    PAST MEDICAL HISTORY: No past medical history on file.  PAST SURGICAL HISTORY: No past surgical history on file.  FAMILY HISTORY: No family history on file.  SOCIAL HISTORY: Social History   Socioeconomic History  . Marital status: Single    Spouse name: Not on file  . Number of children: Not on file  . Years of education: Not on  file  . Highest education level: Not on file  Occupational History  . Not on file  Tobacco Use  . Smoking status: Never Smoker  . Smokeless tobacco: Never Used  Substance and Sexual Activity  . Alcohol use: No  . Drug use: Not Currently  . Sexual activity: Not Currently  Other Topics Concern  . Not on file  Social History Narrative  . Not on file   Social Determinants of Health   Financial Resource Strain:   . Difficulty of Paying Living Expenses:   Food Insecurity:   . Worried About Programme researcher, broadcasting/film/video in the Last Year:   . Barista in the Last Year:   Transportation Needs:   . Freight forwarder (Medical):   Marland Kitchen Lack of Transportation (Non-Medical):   Physical Activity:   . Days of Exercise per Week:   . Minutes of Exercise per Session:   Stress:   . Feeling of Stress :   Social Connections:   . Frequency of Communication with Friends and Family:   . Frequency of Social Gatherings with Friends and Family:   . Attends Religious Services:   . Active Member of Clubs or Organizations:   . Attends Banker Meetings:   Marland Kitchen Marital Status:   Intimate Partner Violence:   . Fear of Current or Ex-Partner:   . Emotionally Abused:   Marland Kitchen Physically Abused:   . Sexually Abused:       PHYSICAL EXAM  There were no vitals filed for this visit. There is no height or weight on file to calculate BMI.  Generalized: Well developed, in no acute distress  Cardiology: normal rate and rhythm, no murmur noted Neurological examination  Mentation: Alert oriented to time, place, history taking. Follows all commands speech and language fluent Cranial nerve II-XII: Pupils were equal round reactive to light. Extraocular movements were full, visual field were full on confrontational test. Facial sensation and strength were normal. Uvula tongue midline. Head turning and shoulder shrug  were normal and symmetric. Motor: The motor testing reveals 5 over 5 strength of all 4  extremities. Good symmetric motor tone is noted throughout.  Sensory: Sensory testing is intact to soft touch on all 4 extremities. No evidence of extinction is noted.  Coordination: Cerebellar testing reveals good finger-nose-finger and heel-to-shin bilaterally.  Gait and station: Gait is normal. Tandem gait is normal. Romberg is negative. No drift is seen.  Reflexes: Deep tendon reflexes are symmetric and normal bilaterally.   DIAGNOSTIC DATA (LABS, IMAGING, TESTING) - I reviewed patient records, labs, notes, testing and imaging myself where available.  No flowsheet data found.   Lab Results  Component Value Date   WBC 8.2 01/28/2019   HGB 15.5 01/28/2019   HCT 46.8 01/28/2019   MCV 91.6 01/28/2019   PLT 191 01/28/2019      Component Value Date/Time   NA 140 01/28/2019 1434   K 4.2 01/28/2019  1434   CL 102 01/28/2019 1434   CO2 26 01/28/2019 1424   GLUCOSE 119 (H) 01/28/2019 1434   BUN 15 01/28/2019 1434   CREATININE 1.00 01/28/2019 1434   CALCIUM 9.2 01/28/2019 1424   GFRNONAA >60 01/28/2019 1424   GFRAA >60 01/28/2019 1424   No results found for: CHOL, HDL, LDLCALC, LDLDIRECT, TRIG, CHOLHDL No results found for: KVQQ5Z No results found for: VITAMINB12 No results found for: TSH     ASSESSMENT AND PLAN 24 y.o. year old male  has no past medical history on file. here with ***  No diagnosis found.     No orders of the defined types were placed in this encounter.    No orders of the defined types were placed in this encounter.     I spent 15 minutes with the patient. 50% of this time was spent counseling and educating patient on plan of care and medications.    Shawnie Dapper, FNP-C 05/13/2019, 12:34 PM [facility] [address] [place], [place] [address] [phone]

## 2019-05-14 ENCOUNTER — Ambulatory Visit: Payer: BC Managed Care – PPO | Admitting: Family Medicine

## 2019-05-14 ENCOUNTER — Encounter: Payer: Self-pay | Admitting: Family Medicine

## 2020-08-28 ENCOUNTER — Emergency Department (HOSPITAL_COMMUNITY): Payer: Self-pay

## 2020-08-28 ENCOUNTER — Ambulatory Visit (HOSPITAL_COMMUNITY)
Admission: EM | Admit: 2020-08-28 | Discharge: 2020-08-28 | Disposition: A | Discharge status: Home / Self Care | Payer: Managed Care, Other (non HMO)

## 2020-08-28 ENCOUNTER — Emergency Department (HOSPITAL_COMMUNITY)
Admission: EM | Admit: 2020-08-28 | Discharge: 2020-08-28 | Disposition: A | Discharge status: Home / Self Care | Payer: Self-pay | Attending: Emergency Medicine | Admitting: Emergency Medicine

## 2020-08-28 ENCOUNTER — Other Ambulatory Visit: Payer: Self-pay

## 2020-08-28 DIAGNOSIS — W19XXXA Unspecified fall, initial encounter: Secondary | ICD-10-CM | POA: Insufficient documentation

## 2020-08-28 DIAGNOSIS — Y9361 Activity, american tackle football: Secondary | ICD-10-CM | POA: Insufficient documentation

## 2020-08-28 DIAGNOSIS — S43102A Unspecified dislocation of left acromioclavicular joint, initial encounter: Secondary | ICD-10-CM

## 2020-08-28 MED ORDER — ACETAMINOPHEN 325 MG PO TABS
650.0000 mg | ORAL_TABLET | Freq: Once | ORAL | Status: AC
  Administered 2020-08-28: 650 mg via ORAL
  Filled 2020-08-28: qty 2

## 2020-08-28 MED ORDER — IBUPROFEN 400 MG PO TABS
600.0000 mg | ORAL_TABLET | Freq: Once | ORAL | Status: AC
  Administered 2020-08-28: 600 mg via ORAL
  Filled 2020-08-28: qty 1

## 2020-08-28 NOTE — Discharge Instructions (Addendum)
Call your primary care doctor or specialist as discussed in the next 2-3 days.   Return immediately back to the ER if:  Your symptoms worsen within the next 12-24 hours. You develop new symptoms such as new fevers, persistent vomiting, new pain, shortness of breath, or new weakness or numbness, or if you have any other concerns.  

## 2020-08-28 NOTE — ED Notes (Signed)
Pt presents with dislocated left shoulder. Pt states this is the first time for dislocation. Per Karie Schwalbe, NP. Pt will need to go straight to ED. Pt is able to take self to ED.

## 2020-08-28 NOTE — Progress Notes (Signed)
Orthopedic Tech Progress Note Patient Details:  Marvin Liu [birth year] [id number]  Ortho Devices Type of Ortho Device: Shoulder immobilizer Ortho Device/Splint Location: Left Ortho Device/Splint Interventions: Ordered, Application, Adjustment   Post Interventions Patient Tolerated: Well Instructions Provided: Adjustment of device  [name] A Klohe Lovering 08/28/2020, 2:33 PM

## 2020-08-28 NOTE — ED Triage Notes (Signed)
Pt from flag football practice for eval of shoulder injury/deformity.

## 2020-08-28 NOTE — ED Provider Notes (Signed)
Emergency Medicine Provider Triage Evaluation Note  Marvin Liu , a 25 y.o. male  was evaluated in triage.  Pt complains of left shoulder injury.  Incident occurred around 1140 this morning.  He was playing flag football when he caught the ball and landed on his left shoulder.  Had acute onset pain.  No numbness or tingling.  Pain is mild and very manageable when he is not moving it.  No injury elsewhere.  Did not hit his head or lose consciousness.  No previous shoulder injury.  Review of Systems  Positive: L shoulder pain Negative: numbness  Physical Exam  BP 133/79   Pulse 84   Temp 98.1 F (36.7 C)   Resp 16   SpO2 99%  Gen:   Awake, no distress   Resp:  Normal effort  MSK:   Obvious deformity of the left shoulder consistent with dislocation.  Radial pulse 2+ bilaterally.  Medical Decision Making  Medically screening exam initiated at 1:08 PM.  Appropriate orders placed.  Marvin Liu was informed that the remainder of the evaluation will be completed by another provider, this initial triage assessment does not replace that evaluation, and the importance of remaining in the ED until their evaluation is complete.  Xray ordered. Pt to be roomed asap.    Alveria Apley, PA-C 08/28/20 1309    Cheryll Cockayne, MD 08/28/20 1416

## 2020-08-28 NOTE — ED Provider Notes (Signed)
[facility] EMERGENCY DEPARTMENT Provider Note   CSN: [id number] Arrival date & time: 08/28/20  1248     History Chief Complaint  Patient presents with   Shoulder Injury    Marvin Liu is a 25 y.o. male.  Patient presents with left shoulder pain and deformity.  Injury occurred today's he was playing sports and fell onto his left shoulder.  Denies head injury or neck pain or back pain or loss of consciousness.  Denies pain elsewhere other than the left shoulder.  No reports of fevers or cough or vomiting or diarrhea.      No past medical history on file.  There are no problems to display for this patient.   No past surgical history on file.     No family history on file.  Social History   Tobacco Use   Smoking status: Never   Smokeless tobacco: Never  Substance Use Topics   Alcohol use: No   Drug use: Not Currently    Home Medications Prior to Admission medications   Medication Sig Start Date End Date Taking? Authorizing Provider  Acetaminophen (TYLENOL) 325 MG CAPS Take by mouth.    [provider]  Ibuprofen (MOTRIN PO) Take by mouth.    [provider]  rizatriptan (MAXALT-MLT) 5 MG disintegrating tablet Take 1 tablet (5 mg total) by mouth as needed for migraine. May repeat in 2 hours if needed 02/13/19   Huston Foley, MD    Allergies    Patient has no known allergies.  Review of Systems   Review of Systems  Constitutional:  Negative for fever.  HENT:  Negative for ear pain and sore throat.   Eyes:  Negative for pain.  Respiratory:  Negative for cough.   Cardiovascular:  Negative for chest pain.  Gastrointestinal:  Negative for abdominal pain.  Genitourinary:  Negative for flank pain.  Musculoskeletal:  Negative for back pain.  Skin:  Negative for color change and rash.  Neurological:  Negative for syncope.  All other systems reviewed and are negative.  Physical Exam Updated Vital Signs BP 133/79   Pulse 84    Temp 98.1 F (36.7 C)   Resp 16   Ht 6' (1.829 m)   Wt 88.5 kg   SpO2 99%   BMI 26.45 kg/m   Physical Exam Constitutional:      General: He is not in acute distress.    Appearance: He is well-developed.  HENT:     Head: Normocephalic.     Nose: Nose normal.  Eyes:     Extraocular Movements: Extraocular movements intact.  Cardiovascular:     Rate and Rhythm: Normal rate.  Pulmonary:     Effort: Pulmonary effort is normal.  Musculoskeletal:     Comments: Left upper extremity shoulder region shows a deformity at the [facility] joint.  Tender to palpation at the [facility] joint.  Neurovascularly intact distally.  Normal passive range of motion for the left shoulder.  However diminished active range of motion for the left shoulder secondary to pain.  Neurovascularly intact distally.  Skin:    Coloration: Skin is not jaundiced.  Neurological:     Mental Status: He is alert. Mental status is at baseline.    ED Results / Procedures / Treatments   Labs (all labs ordered are listed, but only abnormal results are displayed) Labs Reviewed - No data to display  EKG None  Radiology DG Shoulder Left  Result Date: 08/28/2020 CLINICAL DATA:  Left  shoulder pain after landing on shoulder while playing football. EXAM: LEFT SHOULDER - 2+ VIEW COMPARISON:  None. FINDINGS: No acute fracture identified. There is elevation of the distal clavicle relative to the acromion by approximately one shaft width of the distal clavicular height. Associated increase in distance between the coracoid and clavicle. The glenohumeral joint demonstrates normal alignment. IMPRESSION: Left AC joint separation without fracture. Electronically Signed   By: Irish Lack M.D.   On: 08/28/2020 13:54    Procedures .Ortho Injury Treatment  Date/Time: 08/28/2020 2:17 PM Performed by: Cheryll Cockayne, MD Authorized by: Cheryll Cockayne, MD  Post-procedure neurovascular assessment: post-procedure neurovascularly intact Comments: Sling  to the left upper extremity.  Neurovascularly intact afterwards.     Medications Ordered in ED Medications - No data to display  ED Course  I have reviewed the triage vital signs and the nursing notes.  Pertinent labs & imaging results that were available during my care of the patient were reviewed by me and considered in my medical decision making (see chart for details).    MDM Rules/Calculators/A&P                           X-rays show AC separation of the left shoulder.  Patient advised outpatient follow-up with orthopedic surgery within the week.  Advised immediate return for worsening pain fevers or any additional concerns.  Patient declined pain medications here.  Final Clinical Impression(s) / ED Diagnoses Final diagnoses:  Separation of left acromioclavicular joint, initial encounter    Rx / DC Orders ED Discharge Orders     None        Cheryll Cockayne, MD 08/28/20 1419

## 2021-09-09 IMAGING — DX DG SHOULDER 2+V*L*
4 series · 4 of 4 positions shown · non-contrast
Comparison: None.

CLINICAL DATA: Left shoulder pain after landing on shoulder while
playing football.

EXAM:
LEFT SHOULDER - 2+ VIEW

[shoulder grashey]
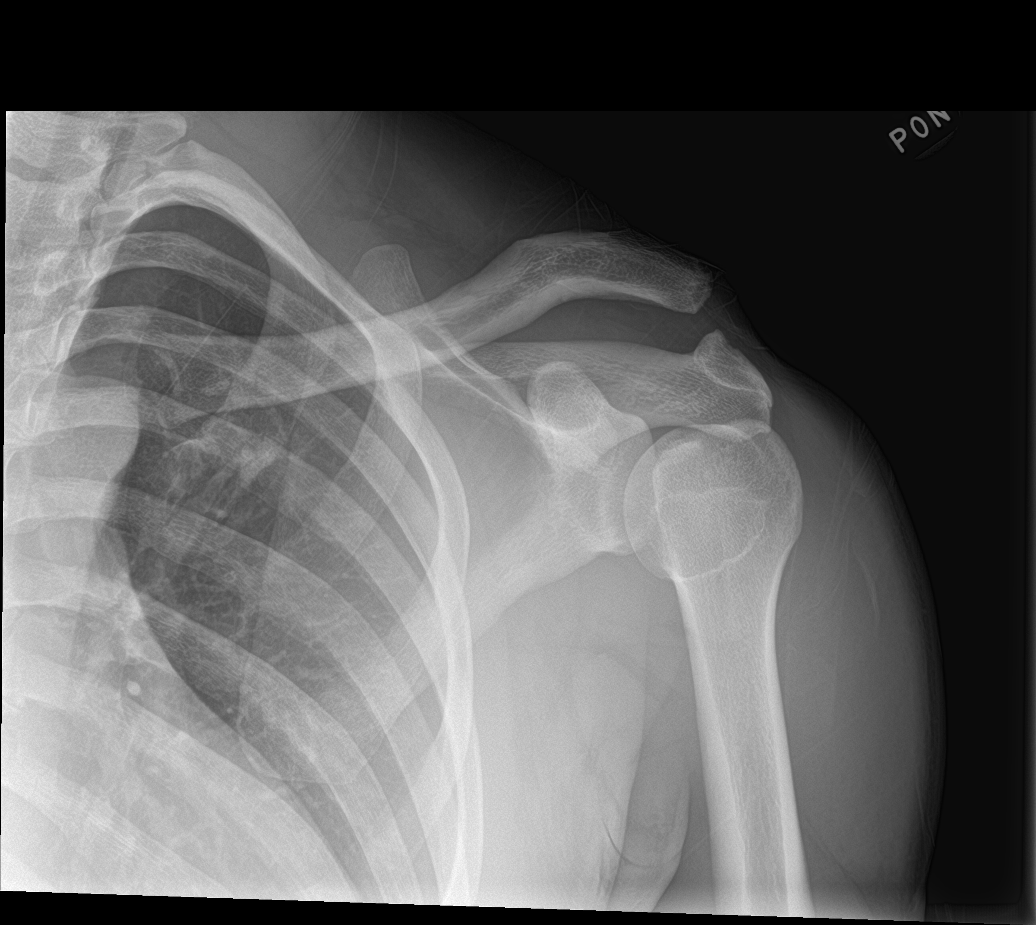

[shoulder y view (1 of 2)]
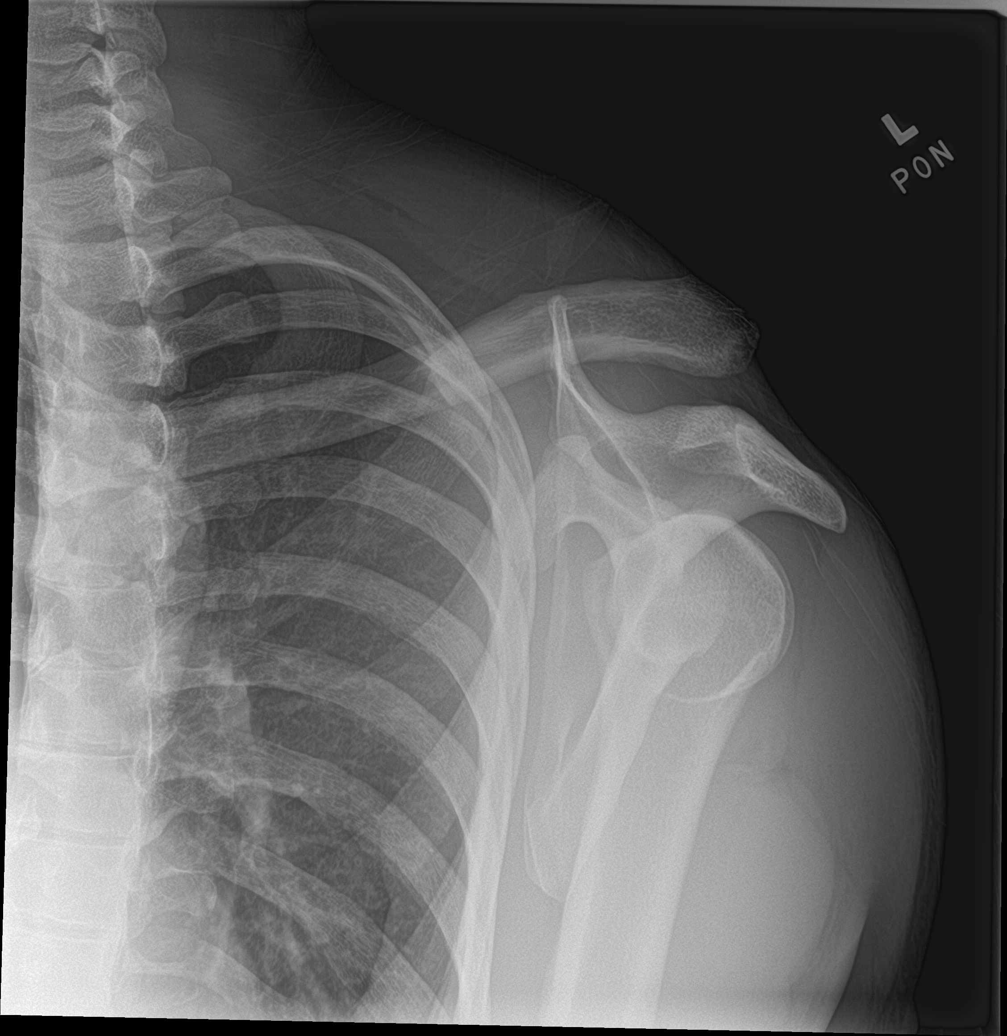

[shoulder ap neutral]
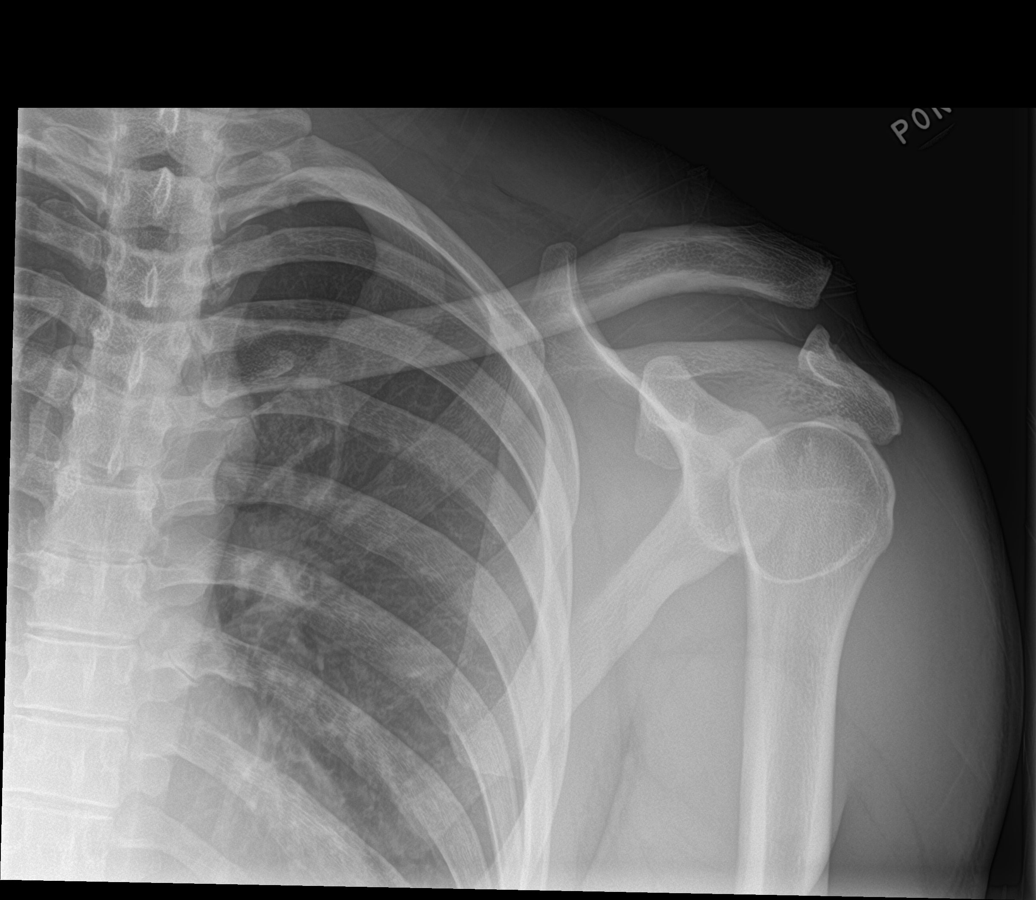

[shoulder y view (2 of 2)]
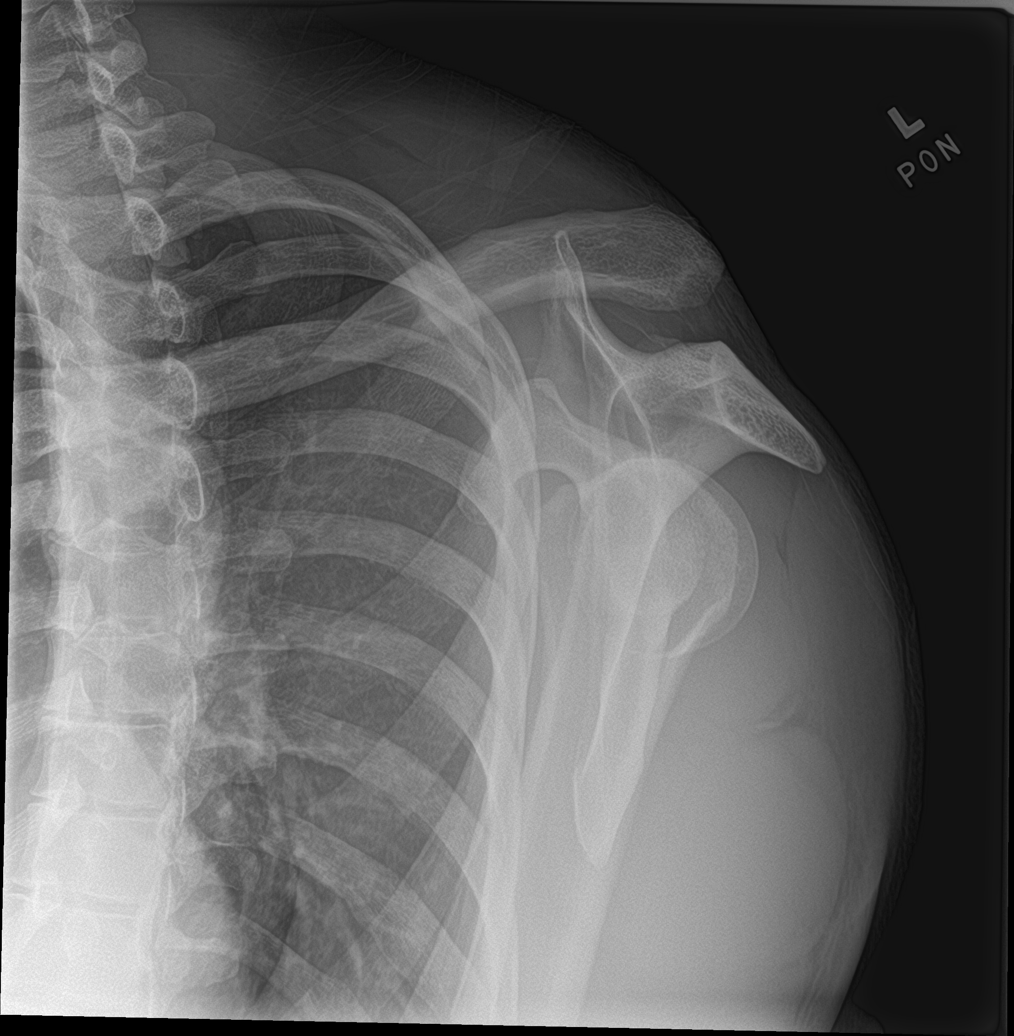

[4 of 4 positions shown; findings below may reference images not displayed]

FINDINGS: No acute fracture identified. There is elevation of the distal
clavicle relative to the acromion by approximately one shaft width
of the distal clavicular height. Associated increase in distance
between the coracoid and clavicle. The glenohumeral joint
demonstrates normal alignment.
IMPRESSION: Left AC joint separation without fracture.
